# Patient Record
Sex: Female | Born: 1945 | Race: White | Hispanic: No | Smoking: Never smoker
Health system: Southern US, Community
[De-identification: ages and names within clinical notes are randomized; demographics above are authoritative.]

## PROBLEM LIST (undated history)

## (undated) HISTORY — PX: APPENDECTOMY: SHX54

## (undated) HISTORY — PX: TONSILLECTOMY: SUR1361

---

## 2008-12-12 ENCOUNTER — Other Ambulatory Visit: Admission: RE | Admit: 2008-12-12 | Discharge: 2008-12-12 | Payer: Self-pay | Admitting: Family Medicine

## 2009-01-07 ENCOUNTER — Encounter: Admission: RE | Admit: 2009-01-07 | Discharge: 2009-01-07 | Payer: Self-pay | Admitting: Family Medicine

## 2009-01-21 ENCOUNTER — Encounter: Admission: RE | Admit: 2009-01-21 | Discharge: 2009-01-21 | Payer: Self-pay | Admitting: Family Medicine

## 2009-12-26 ENCOUNTER — Other Ambulatory Visit
Admission: RE | Admit: 2009-12-26 | Discharge: 2009-12-26 | Payer: Self-pay | Source: Home / Self Care | Admitting: Family Medicine

## 2010-01-28 ENCOUNTER — Encounter
Admission: RE | Admit: 2010-01-28 | Discharge: 2010-01-28 | Payer: Self-pay | Source: Home / Self Care | Attending: Family Medicine | Admitting: Family Medicine

## 2010-12-29 ENCOUNTER — Other Ambulatory Visit: Payer: Self-pay | Admitting: Family Medicine

## 2010-12-29 DIAGNOSIS — M8580 Other specified disorders of bone density and structure, unspecified site: Secondary | ICD-10-CM

## 2011-01-14 ENCOUNTER — Other Ambulatory Visit: Payer: Self-pay | Admitting: Family Medicine

## 2011-01-14 DIAGNOSIS — Z1231 Encounter for screening mammogram for malignant neoplasm of breast: Secondary | ICD-10-CM

## 2011-01-23 ENCOUNTER — Ambulatory Visit
Admission: RE | Admit: 2011-01-23 | Discharge: 2011-01-23 | Disposition: A | Discharge status: Home / Self Care | Payer: Medicare Other | Source: Ambulatory Visit | Attending: Family Medicine | Admitting: Family Medicine

## 2011-01-23 DIAGNOSIS — M8580 Other specified disorders of bone density and structure, unspecified site: Secondary | ICD-10-CM

## 2011-02-17 ENCOUNTER — Ambulatory Visit: Payer: Self-pay

## 2011-02-24 ENCOUNTER — Ambulatory Visit
Admission: RE | Admit: 2011-02-24 | Discharge: 2011-02-24 | Disposition: A | Discharge status: Home / Self Care | Payer: Medicare Other | Source: Ambulatory Visit | Attending: Family Medicine | Admitting: Family Medicine

## 2011-02-24 DIAGNOSIS — Z1231 Encounter for screening mammogram for malignant neoplasm of breast: Secondary | ICD-10-CM | POA: Diagnosis not present

## 2011-10-27 DIAGNOSIS — R238 Other skin changes: Secondary | ICD-10-CM | POA: Diagnosis not present

## 2012-01-01 ENCOUNTER — Other Ambulatory Visit (HOSPITAL_COMMUNITY)
Admission: RE | Admit: 2012-01-01 | Discharge: 2012-01-01 | Disposition: A | Discharge status: Home / Self Care | Payer: Medicare Other | Source: Ambulatory Visit | Attending: Family Medicine | Admitting: Family Medicine

## 2012-01-01 DIAGNOSIS — E559 Vitamin D deficiency, unspecified: Secondary | ICD-10-CM | POA: Diagnosis not present

## 2012-01-01 DIAGNOSIS — M899 Disorder of bone, unspecified: Secondary | ICD-10-CM | POA: Diagnosis not present

## 2012-01-01 DIAGNOSIS — R7301 Impaired fasting glucose: Secondary | ICD-10-CM | POA: Diagnosis not present

## 2012-01-01 DIAGNOSIS — Z0000 Encounter for general adult medical examination without abnormal findings: Secondary | ICD-10-CM | POA: Diagnosis not present

## 2012-01-01 DIAGNOSIS — Z8041 Family history of malignant neoplasm of ovary: Secondary | ICD-10-CM | POA: Diagnosis not present

## 2012-01-01 DIAGNOSIS — M949 Disorder of cartilage, unspecified: Secondary | ICD-10-CM | POA: Diagnosis not present

## 2012-01-01 DIAGNOSIS — Z124 Encounter for screening for malignant neoplasm of cervix: Secondary | ICD-10-CM | POA: Diagnosis not present

## 2012-01-04 DIAGNOSIS — H2510 Age-related nuclear cataract, unspecified eye: Secondary | ICD-10-CM | POA: Diagnosis not present

## 2012-02-01 DIAGNOSIS — Z8041 Family history of malignant neoplasm of ovary: Secondary | ICD-10-CM | POA: Diagnosis not present

## 2012-02-01 DIAGNOSIS — N854 Malposition of uterus: Secondary | ICD-10-CM | POA: Diagnosis not present

## 2012-02-08 DIAGNOSIS — Z23 Encounter for immunization: Secondary | ICD-10-CM | POA: Diagnosis not present

## 2012-04-13 ENCOUNTER — Other Ambulatory Visit: Payer: Self-pay | Admitting: Family Medicine

## 2012-04-13 DIAGNOSIS — Z1231 Encounter for screening mammogram for malignant neoplasm of breast: Secondary | ICD-10-CM

## 2012-05-10 ENCOUNTER — Ambulatory Visit (INDEPENDENT_AMBULATORY_CARE_PROVIDER_SITE_OTHER): Payer: Medicare Other

## 2012-05-10 DIAGNOSIS — Z1231 Encounter for screening mammogram for malignant neoplasm of breast: Secondary | ICD-10-CM

## 2012-05-10 DIAGNOSIS — R928 Other abnormal and inconclusive findings on diagnostic imaging of breast: Secondary | ICD-10-CM

## 2012-05-11 ENCOUNTER — Other Ambulatory Visit: Payer: Self-pay | Admitting: Family Medicine

## 2012-05-11 DIAGNOSIS — R928 Other abnormal and inconclusive findings on diagnostic imaging of breast: Secondary | ICD-10-CM

## 2012-05-24 ENCOUNTER — Ambulatory Visit
Admission: RE | Admit: 2012-05-24 | Discharge: 2012-05-24 | Disposition: A | Discharge status: Home / Self Care | Payer: Medicare Other | Source: Ambulatory Visit | Attending: Family Medicine | Admitting: Family Medicine

## 2012-05-24 DIAGNOSIS — R928 Other abnormal and inconclusive findings on diagnostic imaging of breast: Secondary | ICD-10-CM | POA: Diagnosis not present

## 2012-10-20 DIAGNOSIS — Z23 Encounter for immunization: Secondary | ICD-10-CM | POA: Diagnosis not present

## 2013-01-13 ENCOUNTER — Other Ambulatory Visit: Payer: Self-pay | Admitting: Family Medicine

## 2013-01-13 DIAGNOSIS — Z0000 Encounter for general adult medical examination without abnormal findings: Secondary | ICD-10-CM | POA: Diagnosis not present

## 2013-01-13 DIAGNOSIS — E559 Vitamin D deficiency, unspecified: Secondary | ICD-10-CM | POA: Diagnosis not present

## 2013-01-13 DIAGNOSIS — M899 Disorder of bone, unspecified: Secondary | ICD-10-CM | POA: Diagnosis not present

## 2013-01-13 DIAGNOSIS — M8580 Other specified disorders of bone density and structure, unspecified site: Secondary | ICD-10-CM

## 2013-01-13 DIAGNOSIS — Z1331 Encounter for screening for depression: Secondary | ICD-10-CM | POA: Diagnosis not present

## 2013-01-13 DIAGNOSIS — Z23 Encounter for immunization: Secondary | ICD-10-CM | POA: Diagnosis not present

## 2013-01-13 DIAGNOSIS — Z136 Encounter for screening for cardiovascular disorders: Secondary | ICD-10-CM | POA: Diagnosis not present

## 2013-01-13 DIAGNOSIS — R7301 Impaired fasting glucose: Secondary | ICD-10-CM | POA: Diagnosis not present

## 2013-01-13 DIAGNOSIS — Z13220 Encounter for screening for lipoid disorders: Secondary | ICD-10-CM | POA: Diagnosis not present

## 2013-02-15 ENCOUNTER — Ambulatory Visit
Admission: RE | Admit: 2013-02-15 | Discharge: 2013-02-15 | Disposition: A | Discharge status: Home / Self Care | Payer: Medicare Other | Source: Ambulatory Visit | Attending: Family Medicine | Admitting: Family Medicine

## 2013-02-15 DIAGNOSIS — M899 Disorder of bone, unspecified: Secondary | ICD-10-CM | POA: Diagnosis not present

## 2013-02-15 DIAGNOSIS — M949 Disorder of cartilage, unspecified: Secondary | ICD-10-CM | POA: Diagnosis not present

## 2013-02-15 DIAGNOSIS — M8580 Other specified disorders of bone density and structure, unspecified site: Secondary | ICD-10-CM

## 2013-04-12 ENCOUNTER — Other Ambulatory Visit: Payer: Self-pay | Admitting: Family Medicine

## 2013-04-12 DIAGNOSIS — Z1231 Encounter for screening mammogram for malignant neoplasm of breast: Secondary | ICD-10-CM

## 2013-05-30 ENCOUNTER — Ambulatory Visit (INDEPENDENT_AMBULATORY_CARE_PROVIDER_SITE_OTHER): Payer: Medicare Other

## 2013-05-30 DIAGNOSIS — Z1231 Encounter for screening mammogram for malignant neoplasm of breast: Secondary | ICD-10-CM

## 2013-10-10 DIAGNOSIS — Z23 Encounter for immunization: Secondary | ICD-10-CM | POA: Diagnosis not present

## 2014-01-30 DIAGNOSIS — R7301 Impaired fasting glucose: Secondary | ICD-10-CM | POA: Diagnosis not present

## 2014-01-30 DIAGNOSIS — Z0000 Encounter for general adult medical examination without abnormal findings: Secondary | ICD-10-CM | POA: Diagnosis not present

## 2014-01-30 DIAGNOSIS — M8580 Other specified disorders of bone density and structure, unspecified site: Secondary | ICD-10-CM | POA: Diagnosis not present

## 2014-01-30 DIAGNOSIS — E559 Vitamin D deficiency, unspecified: Secondary | ICD-10-CM | POA: Diagnosis not present

## 2014-01-30 DIAGNOSIS — Z23 Encounter for immunization: Secondary | ICD-10-CM | POA: Diagnosis not present

## 2014-01-30 DIAGNOSIS — Z1211 Encounter for screening for malignant neoplasm of colon: Secondary | ICD-10-CM | POA: Diagnosis not present

## 2014-01-30 DIAGNOSIS — Z1389 Encounter for screening for other disorder: Secondary | ICD-10-CM | POA: Diagnosis not present

## 2014-02-12 DIAGNOSIS — Z1211 Encounter for screening for malignant neoplasm of colon: Secondary | ICD-10-CM | POA: Diagnosis not present

## 2014-05-09 ENCOUNTER — Other Ambulatory Visit: Payer: Self-pay | Admitting: Family Medicine

## 2014-05-09 DIAGNOSIS — Z1231 Encounter for screening mammogram for malignant neoplasm of breast: Secondary | ICD-10-CM

## 2014-05-22 DIAGNOSIS — M7042 Prepatellar bursitis, left knee: Secondary | ICD-10-CM | POA: Diagnosis not present

## 2014-06-20 ENCOUNTER — Ambulatory Visit (INDEPENDENT_AMBULATORY_CARE_PROVIDER_SITE_OTHER): Payer: Medicare Other

## 2014-06-20 DIAGNOSIS — Z1231 Encounter for screening mammogram for malignant neoplasm of breast: Secondary | ICD-10-CM | POA: Diagnosis not present

## 2014-10-09 DIAGNOSIS — Z23 Encounter for immunization: Secondary | ICD-10-CM | POA: Diagnosis not present

## 2015-01-26 IMAGING — MG MM DIGITAL DIAGNOSTIC LIMITED*R*
4 series · 4 of 4 positions shown · non-contrast
Comparison: 02/24/2011, 01/28/2010, 01/07/2009

CLINICAL DATA: The patient returns for evaluation of
calcifications in the right breast noted on recent screening
mammogram dated 05/10/2012.

DIGITAL DIAGNOSTIC RIGHT LIMITED MAMMOGRAM

[R CC]
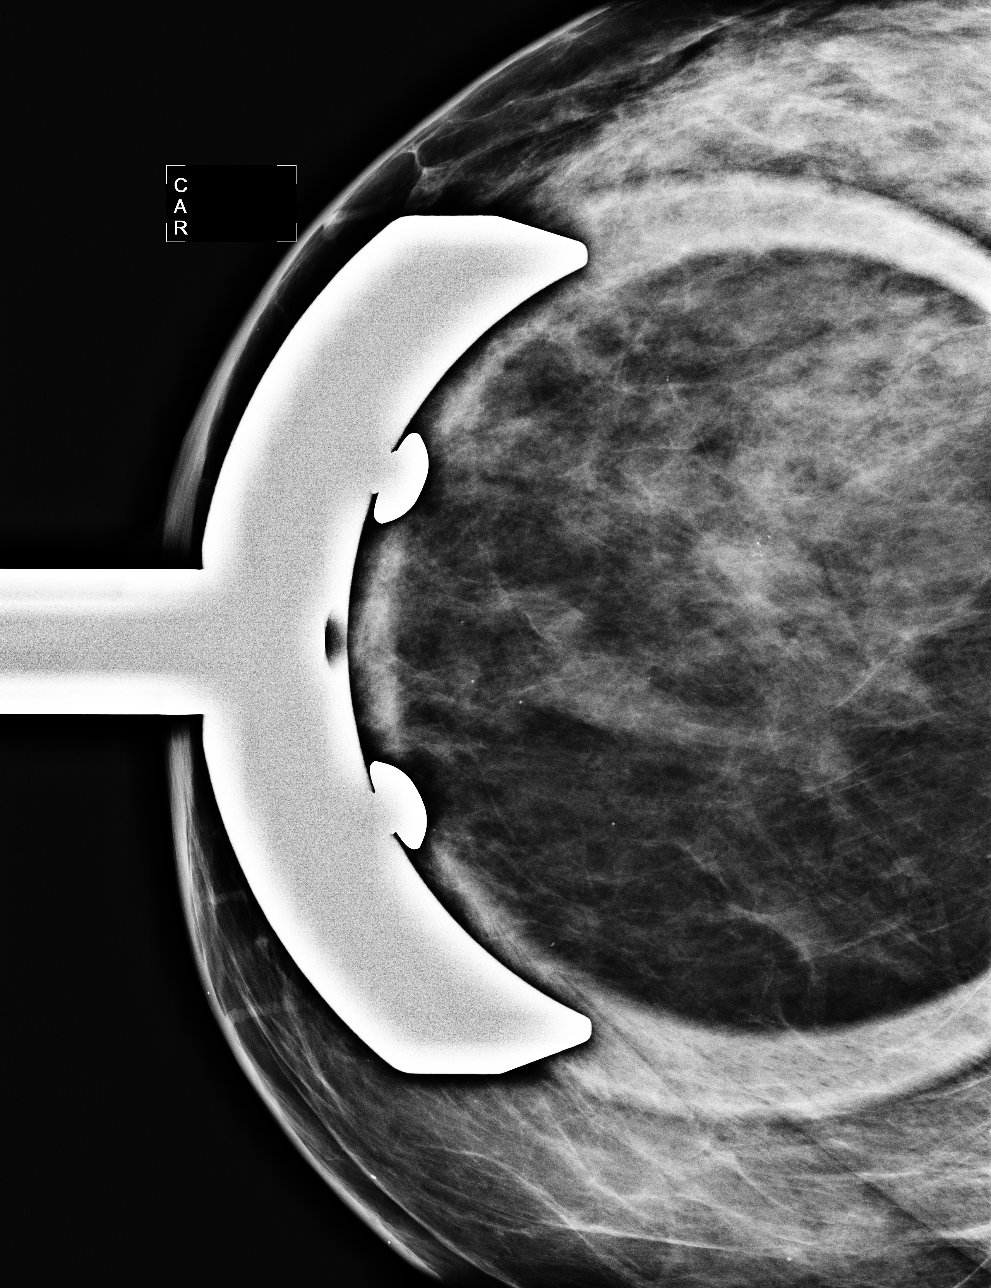

[R ML (1 of 3)]
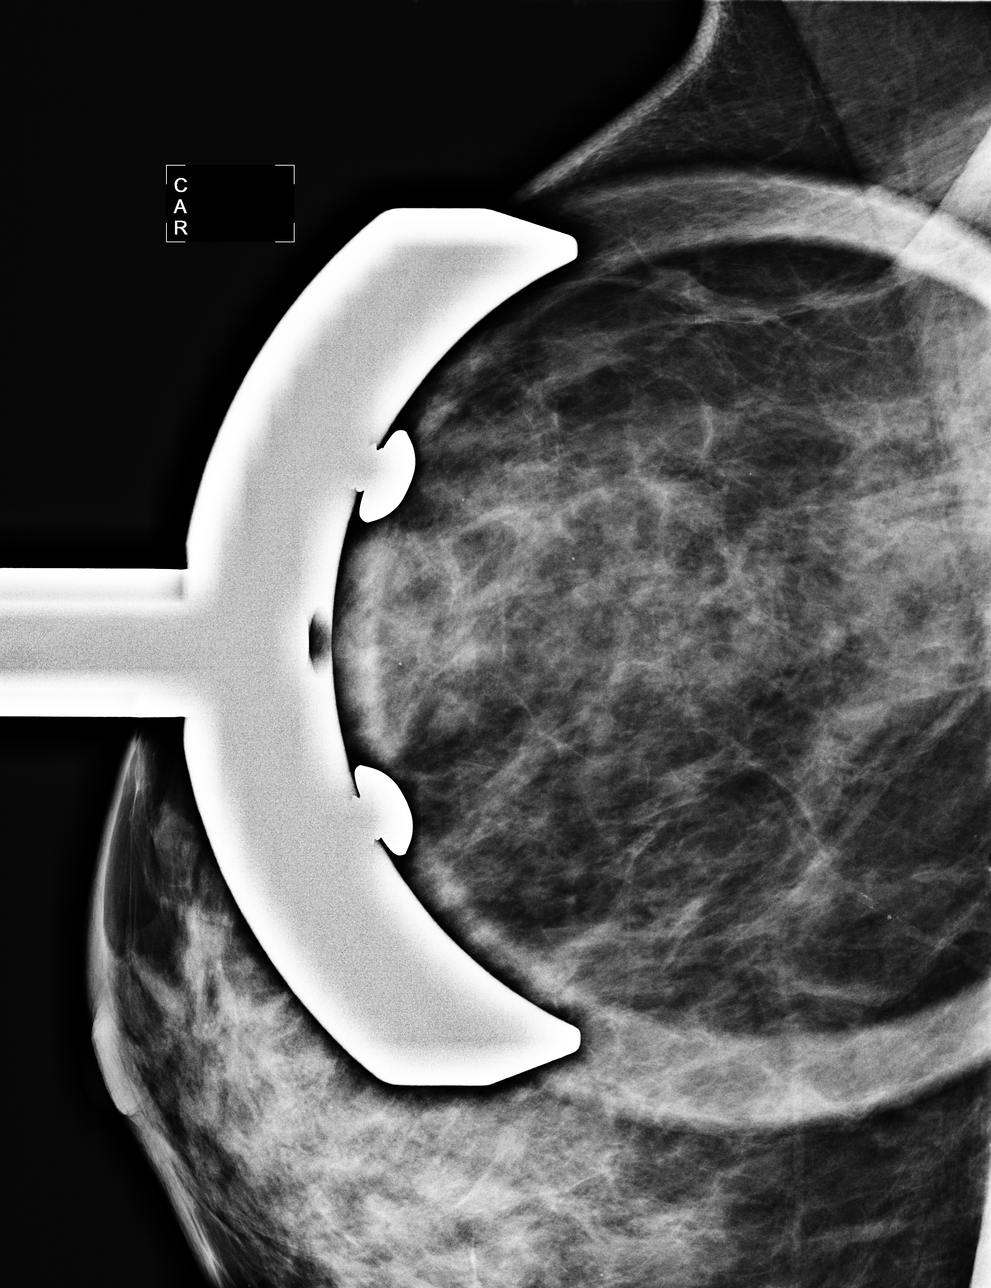

[R ML (2 of 3)]
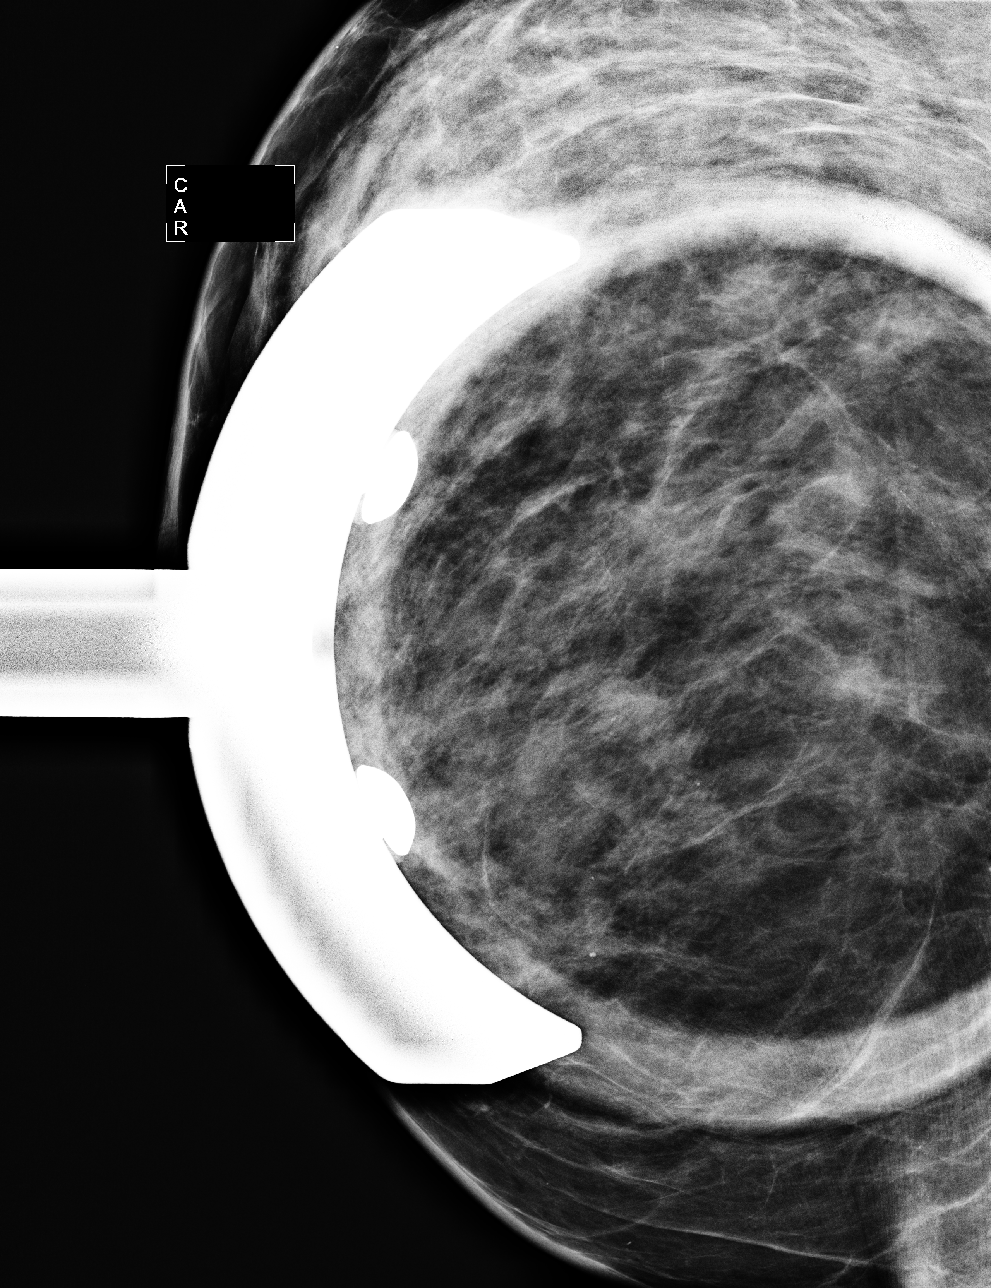

[R ML (3 of 3)]
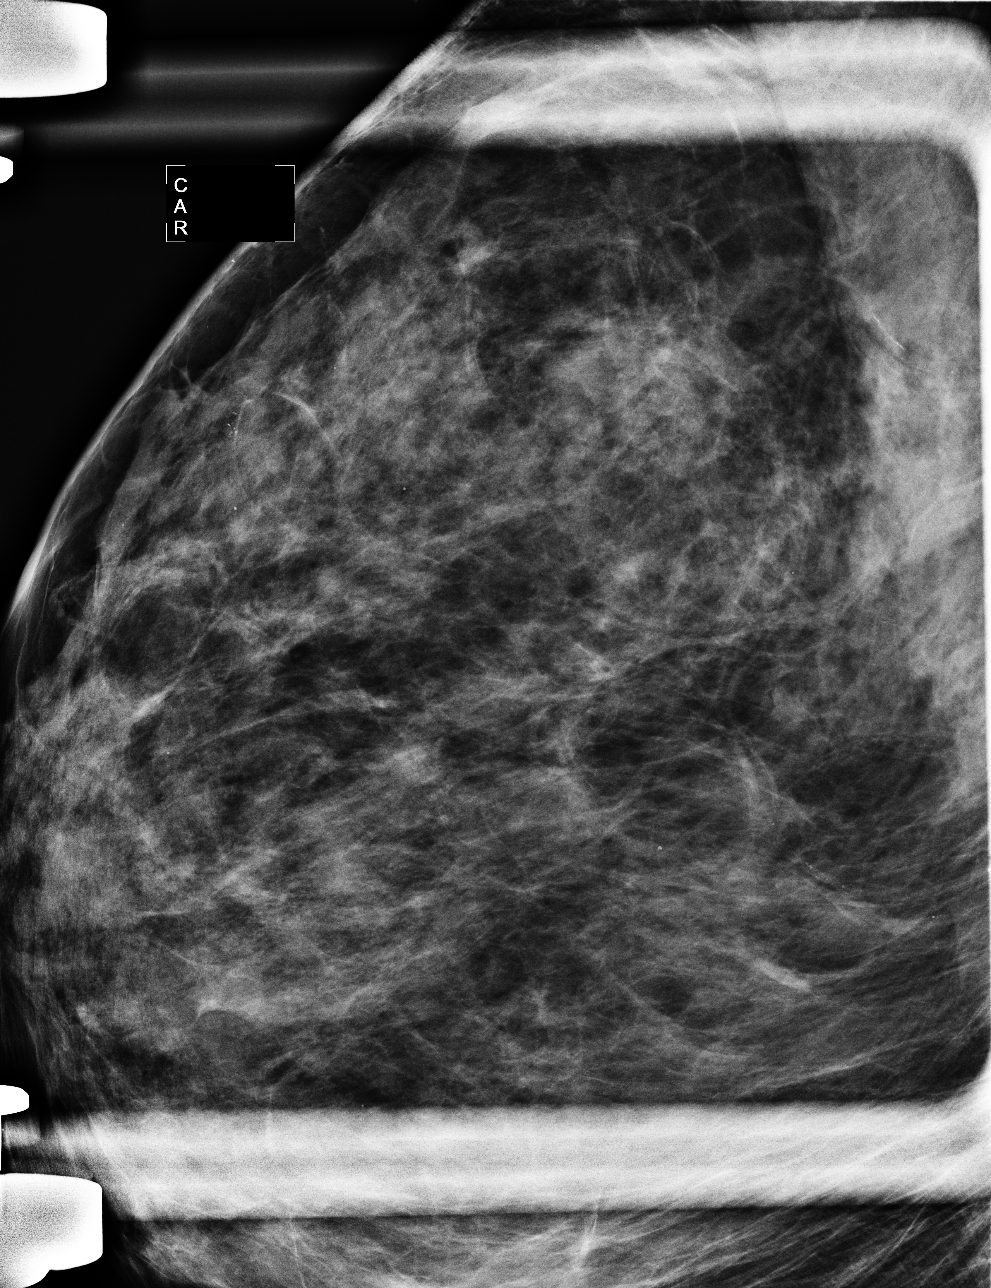

[4 of 4 positions shown; findings below may reference images not displayed]

FINDINGS: ACR Breast Density Category 3: The breast tissue is heterogeneously
dense.

Magnification views demonstrate coarse calcifications that are
arranged in a circular configuration consistent with a degenerating
fibroadenoma.
IMPRESSION: Benign calcifications at 12 o'clock in the midportion of the right
breast.

RECOMMENDATION:
Yearly screening mammography is suggested.

I have discussed the findings and recommendations with the patient.
Results were also provided in writing at the conclusion of the
visit.  If applicable, a reminder letter will be sent to the
patient regarding her next appointment.

BI-RADS CATEGORY 2:  Benign finding(s).

## 2015-04-17 DIAGNOSIS — H1131 Conjunctival hemorrhage, right eye: Secondary | ICD-10-CM | POA: Diagnosis not present

## 2015-04-25 DIAGNOSIS — H16143 Punctate keratitis, bilateral: Secondary | ICD-10-CM | POA: Diagnosis not present

## 2015-05-02 DIAGNOSIS — R7301 Impaired fasting glucose: Secondary | ICD-10-CM | POA: Diagnosis not present

## 2015-05-02 DIAGNOSIS — M8588 Other specified disorders of bone density and structure, other site: Secondary | ICD-10-CM | POA: Diagnosis not present

## 2015-05-02 DIAGNOSIS — Z1389 Encounter for screening for other disorder: Secondary | ICD-10-CM | POA: Diagnosis not present

## 2015-05-02 DIAGNOSIS — E559 Vitamin D deficiency, unspecified: Secondary | ICD-10-CM | POA: Diagnosis not present

## 2015-05-02 DIAGNOSIS — Z0000 Encounter for general adult medical examination without abnormal findings: Secondary | ICD-10-CM | POA: Diagnosis not present

## 2015-05-02 DIAGNOSIS — Z8041 Family history of malignant neoplasm of ovary: Secondary | ICD-10-CM | POA: Diagnosis not present

## 2015-05-03 ENCOUNTER — Other Ambulatory Visit: Payer: Self-pay | Admitting: Family Medicine

## 2015-05-03 DIAGNOSIS — Z8041 Family history of malignant neoplasm of ovary: Secondary | ICD-10-CM

## 2015-05-10 ENCOUNTER — Ambulatory Visit
Admission: RE | Admit: 2015-05-10 | Discharge: 2015-05-10 | Disposition: A | Discharge status: Home / Self Care | Payer: Medicare Other | Source: Ambulatory Visit | Attending: Family Medicine | Admitting: Family Medicine

## 2015-05-10 DIAGNOSIS — Z8041 Family history of malignant neoplasm of ovary: Secondary | ICD-10-CM

## 2015-05-10 DIAGNOSIS — D259 Leiomyoma of uterus, unspecified: Secondary | ICD-10-CM | POA: Diagnosis not present

## 2015-06-14 DIAGNOSIS — H524 Presbyopia: Secondary | ICD-10-CM | POA: Diagnosis not present

## 2015-06-14 DIAGNOSIS — H25813 Combined forms of age-related cataract, bilateral: Secondary | ICD-10-CM | POA: Diagnosis not present

## 2015-06-14 DIAGNOSIS — H527 Unspecified disorder of refraction: Secondary | ICD-10-CM | POA: Diagnosis not present

## 2015-06-14 DIAGNOSIS — Z135 Encounter for screening for eye and ear disorders: Secondary | ICD-10-CM | POA: Diagnosis not present

## 2015-06-19 ENCOUNTER — Other Ambulatory Visit: Payer: Self-pay | Admitting: Family Medicine

## 2015-06-19 DIAGNOSIS — Z1239 Encounter for other screening for malignant neoplasm of breast: Secondary | ICD-10-CM

## 2015-06-19 DIAGNOSIS — Z139 Encounter for screening, unspecified: Secondary | ICD-10-CM

## 2015-07-12 ENCOUNTER — Ambulatory Visit (INDEPENDENT_AMBULATORY_CARE_PROVIDER_SITE_OTHER): Payer: Medicare Other

## 2015-07-12 DIAGNOSIS — Z1231 Encounter for screening mammogram for malignant neoplasm of breast: Secondary | ICD-10-CM | POA: Diagnosis not present

## 2015-07-12 DIAGNOSIS — Z1239 Encounter for other screening for malignant neoplasm of breast: Secondary | ICD-10-CM

## 2015-09-16 ENCOUNTER — Other Ambulatory Visit: Payer: Self-pay | Admitting: Family Medicine

## 2015-09-16 DIAGNOSIS — M8580 Other specified disorders of bone density and structure, unspecified site: Secondary | ICD-10-CM

## 2015-09-24 ENCOUNTER — Ambulatory Visit
Admission: RE | Admit: 2015-09-24 | Discharge: 2015-09-24 | Disposition: A | Discharge status: Home / Self Care | Payer: Medicare Other | Source: Ambulatory Visit | Attending: Family Medicine | Admitting: Family Medicine

## 2015-09-24 DIAGNOSIS — Z780 Asymptomatic menopausal state: Secondary | ICD-10-CM | POA: Diagnosis not present

## 2015-09-24 DIAGNOSIS — M810 Age-related osteoporosis without current pathological fracture: Secondary | ICD-10-CM | POA: Diagnosis not present

## 2015-09-24 DIAGNOSIS — M8580 Other specified disorders of bone density and structure, unspecified site: Secondary | ICD-10-CM

## 2015-11-05 DIAGNOSIS — Z23 Encounter for immunization: Secondary | ICD-10-CM | POA: Diagnosis not present

## 2016-05-04 DIAGNOSIS — M810 Age-related osteoporosis without current pathological fracture: Secondary | ICD-10-CM | POA: Diagnosis not present

## 2016-05-04 DIAGNOSIS — Z1389 Encounter for screening for other disorder: Secondary | ICD-10-CM | POA: Diagnosis not present

## 2016-05-04 DIAGNOSIS — R7301 Impaired fasting glucose: Secondary | ICD-10-CM | POA: Diagnosis not present

## 2016-05-04 DIAGNOSIS — E559 Vitamin D deficiency, unspecified: Secondary | ICD-10-CM | POA: Diagnosis not present

## 2016-05-04 DIAGNOSIS — Z0000 Encounter for general adult medical examination without abnormal findings: Secondary | ICD-10-CM | POA: Diagnosis not present

## 2016-05-04 DIAGNOSIS — Z8041 Family history of malignant neoplasm of ovary: Secondary | ICD-10-CM | POA: Diagnosis not present

## 2016-05-04 DIAGNOSIS — M6530 Trigger finger, unspecified finger: Secondary | ICD-10-CM | POA: Diagnosis not present

## 2016-05-11 DIAGNOSIS — Z124 Encounter for screening for malignant neoplasm of cervix: Secondary | ICD-10-CM | POA: Diagnosis not present

## 2016-06-04 ENCOUNTER — Other Ambulatory Visit: Payer: Self-pay | Admitting: Family Medicine

## 2016-06-04 DIAGNOSIS — Z1231 Encounter for screening mammogram for malignant neoplasm of breast: Secondary | ICD-10-CM

## 2016-07-15 ENCOUNTER — Ambulatory Visit (INDEPENDENT_AMBULATORY_CARE_PROVIDER_SITE_OTHER): Payer: Medicare Other

## 2016-07-15 DIAGNOSIS — Z1231 Encounter for screening mammogram for malignant neoplasm of breast: Secondary | ICD-10-CM

## 2016-07-16 ENCOUNTER — Other Ambulatory Visit: Payer: Self-pay | Admitting: Family Medicine

## 2016-07-16 DIAGNOSIS — Z8041 Family history of malignant neoplasm of ovary: Secondary | ICD-10-CM

## 2016-07-16 DIAGNOSIS — E559 Vitamin D deficiency, unspecified: Secondary | ICD-10-CM

## 2016-07-17 ENCOUNTER — Other Ambulatory Visit: Payer: Self-pay | Admitting: Family Medicine

## 2016-07-17 DIAGNOSIS — Z8041 Family history of malignant neoplasm of ovary: Secondary | ICD-10-CM

## 2016-07-17 DIAGNOSIS — D259 Leiomyoma of uterus, unspecified: Secondary | ICD-10-CM

## 2016-07-23 ENCOUNTER — Ambulatory Visit (INDEPENDENT_AMBULATORY_CARE_PROVIDER_SITE_OTHER): Payer: Medicare Other

## 2016-07-23 DIAGNOSIS — D259 Leiomyoma of uterus, unspecified: Secondary | ICD-10-CM

## 2016-07-23 DIAGNOSIS — N858 Other specified noninflammatory disorders of uterus: Secondary | ICD-10-CM

## 2016-07-23 DIAGNOSIS — Z8041 Family history of malignant neoplasm of ovary: Secondary | ICD-10-CM | POA: Diagnosis not present

## 2016-10-21 DIAGNOSIS — Z23 Encounter for immunization: Secondary | ICD-10-CM | POA: Diagnosis not present

## 2017-03-16 DIAGNOSIS — H527 Unspecified disorder of refraction: Secondary | ICD-10-CM | POA: Diagnosis not present

## 2017-03-16 DIAGNOSIS — H25813 Combined forms of age-related cataract, bilateral: Secondary | ICD-10-CM | POA: Diagnosis not present

## 2017-05-10 DIAGNOSIS — R636 Underweight: Secondary | ICD-10-CM | POA: Diagnosis not present

## 2017-05-10 DIAGNOSIS — M810 Age-related osteoporosis without current pathological fracture: Secondary | ICD-10-CM | POA: Diagnosis not present

## 2017-05-10 DIAGNOSIS — Z1389 Encounter for screening for other disorder: Secondary | ICD-10-CM | POA: Diagnosis not present

## 2017-05-10 DIAGNOSIS — Z0000 Encounter for general adult medical examination without abnormal findings: Secondary | ICD-10-CM | POA: Diagnosis not present

## 2017-05-10 DIAGNOSIS — R7301 Impaired fasting glucose: Secondary | ICD-10-CM | POA: Diagnosis not present

## 2017-05-10 DIAGNOSIS — N63 Unspecified lump in unspecified breast: Secondary | ICD-10-CM | POA: Diagnosis not present

## 2017-05-10 DIAGNOSIS — E559 Vitamin D deficiency, unspecified: Secondary | ICD-10-CM | POA: Diagnosis not present

## 2017-05-10 DIAGNOSIS — Z681 Body mass index (BMI) 19 or less, adult: Secondary | ICD-10-CM | POA: Diagnosis not present

## 2017-05-10 DIAGNOSIS — Z8041 Family history of malignant neoplasm of ovary: Secondary | ICD-10-CM | POA: Diagnosis not present

## 2017-05-10 DIAGNOSIS — Z1159 Encounter for screening for other viral diseases: Secondary | ICD-10-CM | POA: Diagnosis not present

## 2017-05-11 ENCOUNTER — Other Ambulatory Visit: Payer: Self-pay | Admitting: Family Medicine

## 2017-05-11 DIAGNOSIS — N63 Unspecified lump in unspecified breast: Secondary | ICD-10-CM

## 2017-05-13 ENCOUNTER — Ambulatory Visit
Admission: RE | Admit: 2017-05-13 | Discharge: 2017-05-13 | Disposition: A | Discharge status: Home / Self Care | Payer: Medicare Other | Source: Ambulatory Visit | Attending: Family Medicine | Admitting: Family Medicine

## 2017-05-13 DIAGNOSIS — N6489 Other specified disorders of breast: Secondary | ICD-10-CM | POA: Diagnosis not present

## 2017-05-13 DIAGNOSIS — R922 Inconclusive mammogram: Secondary | ICD-10-CM | POA: Diagnosis not present

## 2017-05-13 DIAGNOSIS — N63 Unspecified lump in unspecified breast: Secondary | ICD-10-CM

## 2017-06-25 ENCOUNTER — Other Ambulatory Visit: Payer: Self-pay | Admitting: Family Medicine

## 2017-06-25 DIAGNOSIS — Z1231 Encounter for screening mammogram for malignant neoplasm of breast: Secondary | ICD-10-CM

## 2017-07-23 ENCOUNTER — Ambulatory Visit (INDEPENDENT_AMBULATORY_CARE_PROVIDER_SITE_OTHER): Payer: Medicare Other

## 2017-07-23 DIAGNOSIS — Z1231 Encounter for screening mammogram for malignant neoplasm of breast: Secondary | ICD-10-CM | POA: Diagnosis not present

## 2017-10-14 DIAGNOSIS — Z23 Encounter for immunization: Secondary | ICD-10-CM | POA: Diagnosis not present

## 2018-06-24 ENCOUNTER — Other Ambulatory Visit: Payer: Self-pay | Admitting: Family Medicine

## 2018-06-24 DIAGNOSIS — Z780 Asymptomatic menopausal state: Secondary | ICD-10-CM

## 2018-06-24 DIAGNOSIS — E559 Vitamin D deficiency, unspecified: Secondary | ICD-10-CM | POA: Diagnosis not present

## 2018-06-24 DIAGNOSIS — Z8041 Family history of malignant neoplasm of ovary: Secondary | ICD-10-CM

## 2018-06-24 DIAGNOSIS — R7301 Impaired fasting glucose: Secondary | ICD-10-CM | POA: Diagnosis not present

## 2018-06-24 DIAGNOSIS — N858 Other specified noninflammatory disorders of uterus: Secondary | ICD-10-CM

## 2018-06-24 DIAGNOSIS — D219 Benign neoplasm of connective and other soft tissue, unspecified: Secondary | ICD-10-CM

## 2018-06-24 DIAGNOSIS — N950 Postmenopausal bleeding: Secondary | ICD-10-CM

## 2018-06-27 ENCOUNTER — Other Ambulatory Visit: Payer: Self-pay | Admitting: Family Medicine

## 2018-06-27 DIAGNOSIS — Z8041 Family history of malignant neoplasm of ovary: Secondary | ICD-10-CM

## 2018-06-27 DIAGNOSIS — D259 Leiomyoma of uterus, unspecified: Secondary | ICD-10-CM

## 2018-06-27 DIAGNOSIS — Z124 Encounter for screening for malignant neoplasm of cervix: Secondary | ICD-10-CM

## 2018-06-27 DIAGNOSIS — D219 Benign neoplasm of connective and other soft tissue, unspecified: Secondary | ICD-10-CM

## 2018-07-01 ENCOUNTER — Ambulatory Visit (INDEPENDENT_AMBULATORY_CARE_PROVIDER_SITE_OTHER): Payer: Medicare Other

## 2018-07-01 ENCOUNTER — Other Ambulatory Visit: Payer: Self-pay

## 2018-07-01 DIAGNOSIS — Z8041 Family history of malignant neoplasm of ovary: Secondary | ICD-10-CM

## 2018-07-01 DIAGNOSIS — D259 Leiomyoma of uterus, unspecified: Secondary | ICD-10-CM

## 2018-07-20 ENCOUNTER — Other Ambulatory Visit: Payer: Self-pay | Admitting: Family Medicine

## 2018-07-20 DIAGNOSIS — Z1231 Encounter for screening mammogram for malignant neoplasm of breast: Secondary | ICD-10-CM

## 2018-07-20 DIAGNOSIS — Z9289 Personal history of other medical treatment: Secondary | ICD-10-CM

## 2018-08-17 ENCOUNTER — Ambulatory Visit (INDEPENDENT_AMBULATORY_CARE_PROVIDER_SITE_OTHER): Payer: Medicare Other

## 2018-08-17 ENCOUNTER — Other Ambulatory Visit: Payer: Self-pay

## 2018-08-17 DIAGNOSIS — Z1231 Encounter for screening mammogram for malignant neoplasm of breast: Secondary | ICD-10-CM

## 2018-09-12 DIAGNOSIS — Z23 Encounter for immunization: Secondary | ICD-10-CM | POA: Diagnosis not present

## 2019-06-27 ENCOUNTER — Other Ambulatory Visit: Payer: Self-pay | Admitting: Family Medicine

## 2019-06-27 DIAGNOSIS — E559 Vitamin D deficiency, unspecified: Secondary | ICD-10-CM | POA: Diagnosis not present

## 2019-06-27 DIAGNOSIS — Z1211 Encounter for screening for malignant neoplasm of colon: Secondary | ICD-10-CM | POA: Diagnosis not present

## 2019-06-27 DIAGNOSIS — M810 Age-related osteoporosis without current pathological fracture: Secondary | ICD-10-CM

## 2019-06-27 DIAGNOSIS — Z1239 Encounter for other screening for malignant neoplasm of breast: Secondary | ICD-10-CM | POA: Diagnosis not present

## 2019-06-27 DIAGNOSIS — R7301 Impaired fasting glucose: Secondary | ICD-10-CM | POA: Diagnosis not present

## 2019-06-27 DIAGNOSIS — Z136 Encounter for screening for cardiovascular disorders: Secondary | ICD-10-CM | POA: Diagnosis not present

## 2019-06-27 DIAGNOSIS — R634 Abnormal weight loss: Secondary | ICD-10-CM | POA: Diagnosis not present

## 2019-06-27 DIAGNOSIS — Z8041 Family history of malignant neoplasm of ovary: Secondary | ICD-10-CM | POA: Diagnosis not present

## 2019-06-27 DIAGNOSIS — Z681 Body mass index (BMI) 19 or less, adult: Secondary | ICD-10-CM | POA: Diagnosis not present

## 2019-06-28 ENCOUNTER — Other Ambulatory Visit: Payer: Self-pay | Admitting: Family Medicine

## 2019-07-05 ENCOUNTER — Other Ambulatory Visit: Payer: Medicare Other

## 2019-07-12 ENCOUNTER — Ambulatory Visit (INDEPENDENT_AMBULATORY_CARE_PROVIDER_SITE_OTHER): Payer: Medicare Other

## 2019-07-12 ENCOUNTER — Other Ambulatory Visit: Payer: Self-pay

## 2019-07-12 DIAGNOSIS — Z780 Asymptomatic menopausal state: Secondary | ICD-10-CM | POA: Diagnosis not present

## 2019-07-12 DIAGNOSIS — M810 Age-related osteoporosis without current pathological fracture: Secondary | ICD-10-CM

## 2019-07-18 ENCOUNTER — Other Ambulatory Visit: Payer: Self-pay | Admitting: Family Medicine

## 2019-07-18 DIAGNOSIS — D259 Leiomyoma of uterus, unspecified: Secondary | ICD-10-CM

## 2019-07-18 DIAGNOSIS — Z8041 Family history of malignant neoplasm of ovary: Secondary | ICD-10-CM

## 2019-08-18 ENCOUNTER — Other Ambulatory Visit: Payer: Self-pay | Admitting: Family Medicine

## 2019-08-18 DIAGNOSIS — Z1231 Encounter for screening mammogram for malignant neoplasm of breast: Secondary | ICD-10-CM

## 2019-08-22 ENCOUNTER — Other Ambulatory Visit: Payer: Medicare Other

## 2019-08-24 ENCOUNTER — Other Ambulatory Visit: Payer: Self-pay

## 2019-08-24 ENCOUNTER — Ambulatory Visit (INDEPENDENT_AMBULATORY_CARE_PROVIDER_SITE_OTHER): Payer: Medicare Other

## 2019-08-24 DIAGNOSIS — Z8041 Family history of malignant neoplasm of ovary: Secondary | ICD-10-CM | POA: Diagnosis not present

## 2019-08-24 DIAGNOSIS — N854 Malposition of uterus: Secondary | ICD-10-CM | POA: Diagnosis not present

## 2019-08-24 DIAGNOSIS — D259 Leiomyoma of uterus, unspecified: Secondary | ICD-10-CM | POA: Diagnosis not present

## 2019-09-07 ENCOUNTER — Other Ambulatory Visit: Payer: Self-pay

## 2019-09-07 ENCOUNTER — Ambulatory Visit (INDEPENDENT_AMBULATORY_CARE_PROVIDER_SITE_OTHER): Payer: Medicare Other

## 2019-09-07 DIAGNOSIS — Z1231 Encounter for screening mammogram for malignant neoplasm of breast: Secondary | ICD-10-CM

## 2019-10-23 DIAGNOSIS — Z23 Encounter for immunization: Secondary | ICD-10-CM | POA: Diagnosis not present

## 2019-10-25 DIAGNOSIS — Z0000 Encounter for general adult medical examination without abnormal findings: Secondary | ICD-10-CM | POA: Diagnosis not present

## 2019-10-25 DIAGNOSIS — Z1389 Encounter for screening for other disorder: Secondary | ICD-10-CM | POA: Diagnosis not present

## 2019-11-02 DIAGNOSIS — Z1159 Encounter for screening for other viral diseases: Secondary | ICD-10-CM | POA: Diagnosis not present

## 2019-11-07 DIAGNOSIS — D128 Benign neoplasm of rectum: Secondary | ICD-10-CM | POA: Diagnosis not present

## 2019-11-07 DIAGNOSIS — Z1211 Encounter for screening for malignant neoplasm of colon: Secondary | ICD-10-CM | POA: Diagnosis not present

## 2019-11-10 DIAGNOSIS — D128 Benign neoplasm of rectum: Secondary | ICD-10-CM | POA: Diagnosis not present

## 2019-11-20 DIAGNOSIS — Z23 Encounter for immunization: Secondary | ICD-10-CM | POA: Diagnosis not present

## 2020-01-02 ENCOUNTER — Other Ambulatory Visit: Payer: Self-pay

## 2020-01-02 ENCOUNTER — Emergency Department (INDEPENDENT_AMBULATORY_CARE_PROVIDER_SITE_OTHER)
Admission: EM | Admit: 2020-01-02 | Discharge: 2020-01-02 | Disposition: A | Discharge status: Home / Self Care | Payer: Medicare Other | Source: Home / Self Care | Attending: Family Medicine | Admitting: Family Medicine

## 2020-01-02 DIAGNOSIS — S61239A Puncture wound without foreign body of unspecified finger without damage to nail, initial encounter: Secondary | ICD-10-CM

## 2020-01-02 NOTE — ED Triage Notes (Signed)
Pt presents with R  Ring finger pain after picking up a broken solar light yesterday. Patient did try peroxide with no improvement of symptoms. Pain with pressure. Pt can not find any visible debree in finger but is still in ain. Finger red but no visible abrasion.

## 2020-01-02 NOTE — ED Provider Notes (Signed)
[facility]    CSN: [id number] Arrival date & time: 01/02/20  1507      History   Chief Complaint Chief Complaint  Patient presents with  . Hand Pain    R hand     HPI Cathy Young is a 74 y.o. female.   HPI  Patient picked up a piece of broken glass yesterday. She felt a crack in her finger. She thinks there might still be glass in her finger. It does not bleed. It does not hurt. She is here for evaluation  History reviewed. No pertinent past medical history.  There are no problems to display for this patient.   Past Surgical History:  Procedure Laterality Date  . APPENDECTOMY    . TONSILLECTOMY      OB History   No obstetric history on file.      Home Medications    Prior to Admission medications   Not on File    Family History Family History  Problem Relation Age of Onset  . Cancer Mother     Social History Social History   Tobacco Use  . Smoking status: Never Smoker  Substance Use Topics  . Alcohol use: Yes  . Drug use: Not on file     Allergies   Patient has no allergy information on record.   Review of Systems Review of Systems See HPI  Physical Exam Triage Vital Signs ED Triage Vitals  Enc Vitals Group     BP 01/02/20 1543 (!) 173/78     Pulse Rate 01/02/20 1543 91     Resp 01/02/20 1543 16     Temp 01/02/20 1543 97.6 F (36.4 C)     Temp Source 01/02/20 1543 Oral     SpO2 01/02/20 1543 98 %     Weight 01/02/20 1540 104 lb (47.2 kg)     Height 01/02/20 1540 5\' 5"  (1.651 m)     Head Circumference --      Peak Flow --      Pain Score 01/02/20 1540 0     Pain Loc --      Pain Edu? --      Excl. in [place]? --    No data found.  Updated Vital Signs BP (!) 173/78 (BP Location: Left Arm)   Pulse 91   Temp 97.6 F (36.4 C) (Oral)   Resp 16   Ht 5\' 5"  (1.651 m)   Wt 47.2 kg   SpO2 98%   BMI 17.31 kg/m     Physical Exam Constitutional:      General: She is not in acute distress.    Appearance: She is  well-developed.  HENT:     Head: Normocephalic and atraumatic.     Nose:     Comments: Mask is in place Eyes:     Conjunctiva/sclera: Conjunctivae normal.     Pupils: Pupils are equal, round, and reactive to light.  Cardiovascular:     Rate and Rhythm: Normal rate.  Pulmonary:     Effort: Pulmonary effort is normal. No respiratory distress.  Abdominal:     General: There is no distension.     Palpations: Abdomen is soft.  Musculoskeletal:        General: Normal range of motion.     Cervical back: Normal range of motion.  Skin:    General: Skin is warm and dry.     Comments: The pad of the right ring finger is examined. No wound is  seen. Cannot palpate any foreign body  Neurological:     Mental Status: She is alert.  Psychiatric:        Behavior: Behavior normal.      UC Treatments / Results  Labs (all labs ordered are listed, but only abnormal results are displayed) Labs Reviewed - No data to display  EKG   Radiology No results found.  Procedures Procedures (including critical care time)  Medications Ordered in UC Medications - No data to display  Initial Impression / Assessment and Plan / UC Course  I have reviewed the triage vital signs and the nursing notes.  Pertinent labs & imaging results that were available during my care of the patient were reviewed by me and considered in my medical decision making (see chart for details).     Reviewed with patient to watch for signs of infection Final Clinical Impressions(s) / UC Diagnoses   Final diagnoses:  Puncture wound of finger, initial encounter     Discharge Instructions     Return for any redness or swelling   ED Prescriptions    None     PDMP not reviewed this encounter.   Raylene Everts, MD 01/02/20 [phone]

## 2020-01-02 NOTE — Discharge Instructions (Addendum)
Return for any redness or swelling

## 2020-05-15 DIAGNOSIS — Z23 Encounter for immunization: Secondary | ICD-10-CM | POA: Diagnosis not present

## 2020-06-28 DIAGNOSIS — M810 Age-related osteoporosis without current pathological fracture: Secondary | ICD-10-CM | POA: Diagnosis not present

## 2020-06-28 DIAGNOSIS — M1611 Unilateral primary osteoarthritis, right hip: Secondary | ICD-10-CM | POA: Diagnosis not present

## 2020-06-28 DIAGNOSIS — R7303 Prediabetes: Secondary | ICD-10-CM | POA: Diagnosis not present

## 2020-06-28 DIAGNOSIS — E559 Vitamin D deficiency, unspecified: Secondary | ICD-10-CM | POA: Diagnosis not present

## 2020-06-28 DIAGNOSIS — Z8041 Family history of malignant neoplasm of ovary: Secondary | ICD-10-CM | POA: Diagnosis not present

## 2020-06-28 DIAGNOSIS — Z681 Body mass index (BMI) 19 or less, adult: Secondary | ICD-10-CM | POA: Diagnosis not present

## 2020-06-28 DIAGNOSIS — K635 Polyp of colon: Secondary | ICD-10-CM | POA: Diagnosis not present

## 2020-08-13 ENCOUNTER — Other Ambulatory Visit: Payer: Self-pay | Admitting: Family Medicine

## 2020-08-13 DIAGNOSIS — Z1231 Encounter for screening mammogram for malignant neoplasm of breast: Secondary | ICD-10-CM

## 2020-09-19 ENCOUNTER — Other Ambulatory Visit: Payer: Self-pay

## 2020-09-19 ENCOUNTER — Other Ambulatory Visit: Payer: Self-pay | Admitting: Family Medicine

## 2020-09-19 ENCOUNTER — Ambulatory Visit (INDEPENDENT_AMBULATORY_CARE_PROVIDER_SITE_OTHER): Payer: Medicare Other

## 2020-09-19 ENCOUNTER — Other Ambulatory Visit (HOSPITAL_BASED_OUTPATIENT_CLINIC_OR_DEPARTMENT_OTHER): Payer: Self-pay | Admitting: Family Medicine

## 2020-09-19 DIAGNOSIS — Z1231 Encounter for screening mammogram for malignant neoplasm of breast: Secondary | ICD-10-CM

## 2020-10-28 DIAGNOSIS — Z0000 Encounter for general adult medical examination without abnormal findings: Secondary | ICD-10-CM | POA: Diagnosis not present

## 2020-10-28 DIAGNOSIS — Z1389 Encounter for screening for other disorder: Secondary | ICD-10-CM | POA: Diagnosis not present

## 2020-11-08 DIAGNOSIS — Z23 Encounter for immunization: Secondary | ICD-10-CM | POA: Diagnosis not present

## 2021-07-01 DIAGNOSIS — E559 Vitamin D deficiency, unspecified: Secondary | ICD-10-CM | POA: Diagnosis not present

## 2021-07-01 DIAGNOSIS — E46 Unspecified protein-calorie malnutrition: Secondary | ICD-10-CM | POA: Diagnosis not present

## 2021-07-01 DIAGNOSIS — R636 Underweight: Secondary | ICD-10-CM | POA: Diagnosis not present

## 2021-07-01 DIAGNOSIS — R7303 Prediabetes: Secondary | ICD-10-CM | POA: Diagnosis not present

## 2021-07-01 DIAGNOSIS — M810 Age-related osteoporosis without current pathological fracture: Secondary | ICD-10-CM | POA: Diagnosis not present

## 2021-07-01 DIAGNOSIS — F5101 Primary insomnia: Secondary | ICD-10-CM | POA: Diagnosis not present

## 2021-07-01 DIAGNOSIS — Z86010 Personal history of colonic polyps: Secondary | ICD-10-CM | POA: Diagnosis not present

## 2021-07-01 DIAGNOSIS — Z681 Body mass index (BMI) 19 or less, adult: Secondary | ICD-10-CM | POA: Diagnosis not present

## 2021-07-01 DIAGNOSIS — R634 Abnormal weight loss: Secondary | ICD-10-CM | POA: Diagnosis not present

## 2021-07-01 DIAGNOSIS — M1611 Unilateral primary osteoarthritis, right hip: Secondary | ICD-10-CM | POA: Diagnosis not present

## 2021-07-21 DIAGNOSIS — Z23 Encounter for immunization: Secondary | ICD-10-CM | POA: Diagnosis not present

## 2021-08-21 ENCOUNTER — Other Ambulatory Visit: Payer: Self-pay | Admitting: Family Medicine

## 2021-08-21 DIAGNOSIS — Z1231 Encounter for screening mammogram for malignant neoplasm of breast: Secondary | ICD-10-CM

## 2021-10-02 ENCOUNTER — Ambulatory Visit (INDEPENDENT_AMBULATORY_CARE_PROVIDER_SITE_OTHER): Payer: Medicare Other

## 2021-10-02 DIAGNOSIS — Z1231 Encounter for screening mammogram for malignant neoplasm of breast: Secondary | ICD-10-CM | POA: Diagnosis not present

## 2021-10-15 DIAGNOSIS — Z23 Encounter for immunization: Secondary | ICD-10-CM | POA: Diagnosis not present

## 2021-10-29 DIAGNOSIS — M1611 Unilateral primary osteoarthritis, right hip: Secondary | ICD-10-CM | POA: Diagnosis not present

## 2021-10-29 DIAGNOSIS — M810 Age-related osteoporosis without current pathological fracture: Secondary | ICD-10-CM | POA: Diagnosis not present

## 2021-10-29 DIAGNOSIS — Z681 Body mass index (BMI) 19 or less, adult: Secondary | ICD-10-CM | POA: Diagnosis not present

## 2021-10-29 DIAGNOSIS — Z9989 Dependence on other enabling machines and devices: Secondary | ICD-10-CM | POA: Diagnosis not present

## 2021-10-29 DIAGNOSIS — E46 Unspecified protein-calorie malnutrition: Secondary | ICD-10-CM | POA: Diagnosis not present

## 2021-10-29 DIAGNOSIS — Z0000 Encounter for general adult medical examination without abnormal findings: Secondary | ICD-10-CM | POA: Diagnosis not present

## 2021-10-30 DIAGNOSIS — Z23 Encounter for immunization: Secondary | ICD-10-CM | POA: Diagnosis not present

## 2022-03-04 DIAGNOSIS — Z681 Body mass index (BMI) 19 or less, adult: Secondary | ICD-10-CM | POA: Diagnosis not present

## 2022-03-04 DIAGNOSIS — E46 Unspecified protein-calorie malnutrition: Secondary | ICD-10-CM | POA: Diagnosis not present

## 2022-08-25 ENCOUNTER — Other Ambulatory Visit: Payer: Self-pay | Admitting: Family Medicine

## 2022-08-25 DIAGNOSIS — Z1231 Encounter for screening mammogram for malignant neoplasm of breast: Secondary | ICD-10-CM

## 2022-08-31 DIAGNOSIS — E46 Unspecified protein-calorie malnutrition: Secondary | ICD-10-CM | POA: Diagnosis not present

## 2022-08-31 DIAGNOSIS — Z713 Dietary counseling and surveillance: Secondary | ICD-10-CM | POA: Diagnosis not present

## 2022-10-14 ENCOUNTER — Ambulatory Visit: Payer: Medicare Other

## 2022-10-14 DIAGNOSIS — Z1231 Encounter for screening mammogram for malignant neoplasm of breast: Secondary | ICD-10-CM | POA: Diagnosis not present

## 2022-10-14 DIAGNOSIS — Z23 Encounter for immunization: Secondary | ICD-10-CM | POA: Diagnosis not present

## 2022-11-11 DIAGNOSIS — Z23 Encounter for immunization: Secondary | ICD-10-CM | POA: Diagnosis not present

## 2022-11-11 DIAGNOSIS — Z0000 Encounter for general adult medical examination without abnormal findings: Secondary | ICD-10-CM | POA: Diagnosis not present

## 2022-11-11 DIAGNOSIS — Z681 Body mass index (BMI) 19 or less, adult: Secondary | ICD-10-CM | POA: Diagnosis not present

## 2022-11-11 DIAGNOSIS — Z1331 Encounter for screening for depression: Secondary | ICD-10-CM | POA: Diagnosis not present

## 2023-05-10 DIAGNOSIS — Z860101 Personal history of adenomatous and serrated colon polyps: Secondary | ICD-10-CM | POA: Diagnosis not present

## 2023-05-10 DIAGNOSIS — M810 Age-related osteoporosis without current pathological fracture: Secondary | ICD-10-CM | POA: Diagnosis not present

## 2023-05-10 DIAGNOSIS — R636 Underweight: Secondary | ICD-10-CM | POA: Diagnosis not present

## 2023-05-10 DIAGNOSIS — Z681 Body mass index (BMI) 19 or less, adult: Secondary | ICD-10-CM | POA: Diagnosis not present

## 2023-05-10 DIAGNOSIS — R030 Elevated blood-pressure reading, without diagnosis of hypertension: Secondary | ICD-10-CM | POA: Diagnosis not present

## 2023-05-10 DIAGNOSIS — Z9989 Dependence on other enabling machines and devices: Secondary | ICD-10-CM | POA: Diagnosis not present

## 2023-10-05 ENCOUNTER — Other Ambulatory Visit: Payer: Self-pay | Admitting: Family Medicine

## 2023-10-05 DIAGNOSIS — Z1231 Encounter for screening mammogram for malignant neoplasm of breast: Secondary | ICD-10-CM

## 2023-10-27 DIAGNOSIS — Z23 Encounter for immunization: Secondary | ICD-10-CM | POA: Diagnosis not present

## 2023-11-16 DIAGNOSIS — Z0000 Encounter for general adult medical examination without abnormal findings: Secondary | ICD-10-CM | POA: Diagnosis not present

## 2023-11-16 DIAGNOSIS — Z1331 Encounter for screening for depression: Secondary | ICD-10-CM | POA: Diagnosis not present

## 2024-01-12 ENCOUNTER — Ambulatory Visit (INDEPENDENT_AMBULATORY_CARE_PROVIDER_SITE_OTHER)

## 2024-01-12 DIAGNOSIS — Z1231 Encounter for screening mammogram for malignant neoplasm of breast: Secondary | ICD-10-CM
